# Patient Record
Sex: Female | Born: 1955 | Marital: Married | Smoking: Former smoker
Health system: Southern US, Community
[De-identification: ages and names within clinical notes are randomized; demographics above are authoritative.]

## PROBLEM LIST (undated history)

## (undated) ENCOUNTER — Emergency Department (HOSPITAL_COMMUNITY): Payer: BLUE CROSS/BLUE SHIELD | Source: Home / Self Care

## (undated) DIAGNOSIS — I10 Essential (primary) hypertension: Secondary | ICD-10-CM

## (undated) DIAGNOSIS — K219 Gastro-esophageal reflux disease without esophagitis: Secondary | ICD-10-CM

## (undated) HISTORY — DX: Essential (primary) hypertension: I10

## (undated) HISTORY — DX: Gastro-esophageal reflux disease without esophagitis: K21.9

## (undated) HISTORY — PX: TUBAL LIGATION: SHX77

## (undated) HISTORY — PX: CHOLECYSTECTOMY: SHX55

---

## 2014-01-20 ENCOUNTER — Ambulatory Visit (HOSPITAL_BASED_OUTPATIENT_CLINIC_OR_DEPARTMENT_OTHER)
Admission: RE | Admit: 2014-01-20 | Discharge: 2014-01-20 | Disposition: A | Discharge status: Home / Self Care | Payer: BC Managed Care – PPO | Source: Ambulatory Visit | Attending: Diagnostic Radiology | Admitting: Diagnostic Radiology

## 2014-01-20 ENCOUNTER — Encounter: Payer: Self-pay | Admitting: Family Medicine

## 2014-01-20 ENCOUNTER — Other Ambulatory Visit: Payer: Self-pay | Admitting: Physician Assistant

## 2014-01-20 ENCOUNTER — Ambulatory Visit (INDEPENDENT_AMBULATORY_CARE_PROVIDER_SITE_OTHER): Payer: BC Managed Care – PPO | Admitting: Family Medicine

## 2014-01-20 VITALS — BP 137/92 | HR 67 | Ht 60.0 in | Wt 160.0 lb

## 2014-01-20 DIAGNOSIS — M79662 Pain in left lower leg: Secondary | ICD-10-CM

## 2014-01-20 DIAGNOSIS — M7989 Other specified soft tissue disorders: Secondary | ICD-10-CM | POA: Diagnosis not present

## 2014-01-20 DIAGNOSIS — M25562 Pain in left knee: Secondary | ICD-10-CM

## 2014-01-20 MED ORDER — MELOXICAM 15 MG PO TABS: 15.0000 mg | ORAL_TABLET | Freq: Every day | ORAL | Status: DC

## 2014-01-20 NOTE — Patient Instructions (Signed)
Your ultrasound was negative for a blood clot. This is due to spasms/strain in your popliteus muscle. Use heat 15 minutes at a time 3-4 times a day. Meloxicam 15mg  daily with food for pain and inflammation. Consider a knee brace for support. Start physical therapy to work on regaining motion and strength in this knee. Follow up with me in 1 month for reevaluation.

## 2014-01-26 ENCOUNTER — Encounter: Payer: Self-pay | Admitting: Family Medicine

## 2014-01-26 DIAGNOSIS — M25562 Pain in left knee: Secondary | ICD-10-CM | POA: Insufficient documentation

## 2014-01-26 NOTE — Progress Notes (Signed)
PCP: Bernadette Hoitafaela Aguiar MD  Subjective:   HPI: Patient is a 58 y.o. female here for left knee pain.  Patient reports for the past 3 days she has had worsening posterior left knee pain. Did have some slight pain here a week ago but resolved. Then worsened when she walked on a treadmill. Painful to bend her knee. No prior issues. No trauma. No bruising.  Maybe some slight swelling. Has been icing, took tylenol once. No catching or locking.  Past Medical History  Diagnosis Date  . Hypertension   . GERD (gastroesophageal reflux disease)     No current outpatient prescriptions on file prior to visit.   No current facility-administered medications on file prior to visit.    Past Surgical History  Procedure Laterality Date  . Cholecystectomy    . Tubal ligation      No Known Allergies  History   Social History  . Marital Status: Married    Spouse Name: N/A    Number of Children: N/A  . Years of Education: N/A   Occupational History  . Not on file.   Social History Main Topics  . Smoking status: Never Smoker   . Smokeless tobacco: Not on file  . Alcohol Use: Not on file  . Drug Use: Not on file  . Sexual Activity: Not on file   Other Topics Concern  . Not on file   Social History Narrative    No family history on file.  BP 137/92 mmHg  Pulse 67  Ht 5' (1.524 m)  Wt 160 lb (72.576 kg)  BMI 31.25 kg/m2  Review of Systems: See HPI above.    Objective:  Physical Exam:  Gen: NAD  Left knee: No gross deformity, ecchymoses, swelling.  No palpable cords. TTP popliteal fossa.  No joint line, post patellar facet, hamstring, calf tenderness otherwise. ROM 0 - 70 degrees but very tentative, guarding with flexion. Negative ant/post drawers (cannot get fully to 90 degrees though). Negative valgus/varus testing. Negative lachmanns. Negative mcmurrays, apleys, patellar apprehension. NV intact distally.    Assessment & Plan:  1. Left knee pain - doppler u/s  done given location of pain without injury and severity - negative for DVT.  Suspect this is due to popliteus spasms/strain.  Heat, meloxicam.  Start physical therapy to work on motion, strengthening, and modalities.  Consider knee brace.  F/u in 1 month for reevaluation.

## 2014-01-26 NOTE — Assessment & Plan Note (Signed)
doppler u/s done given location of pain without injury and severity - negative for DVT.  Suspect this is due to popliteus spasms/strain.  Heat, meloxicam.  Start physical therapy to work on motion, strengthening, and modalities.  Consider knee brace.  F/u in 1 month for reevaluation.

## 2014-01-31 ENCOUNTER — Ambulatory Visit: Payer: BC Managed Care – PPO

## 2014-02-08 ENCOUNTER — Ambulatory Visit: Payer: BC Managed Care – PPO | Admitting: Rehabilitation

## 2014-02-21 ENCOUNTER — Ambulatory Visit: Payer: BC Managed Care – PPO | Attending: Family Medicine | Admitting: Rehabilitation

## 2014-02-21 DIAGNOSIS — M25562 Pain in left knee: Secondary | ICD-10-CM | POA: Insufficient documentation

## 2014-02-27 ENCOUNTER — Ambulatory Visit: Payer: BC Managed Care – PPO | Admitting: Rehabilitation

## 2014-03-02 ENCOUNTER — Ambulatory Visit: Payer: BC Managed Care – PPO | Admitting: Rehabilitation

## 2014-03-07 ENCOUNTER — Ambulatory Visit: Payer: BC Managed Care – PPO | Admitting: Rehabilitation

## 2014-03-10 ENCOUNTER — Ambulatory Visit: Payer: BC Managed Care – PPO | Admitting: Physical Therapy

## 2014-03-13 ENCOUNTER — Ambulatory Visit: Payer: BC Managed Care – PPO | Admitting: Rehabilitation

## 2014-03-15 ENCOUNTER — Encounter: Payer: BC Managed Care – PPO | Admitting: Rehabilitation

## 2014-03-20 ENCOUNTER — Encounter: Payer: BC Managed Care – PPO | Admitting: Rehabilitation

## 2014-03-23 ENCOUNTER — Encounter: Payer: BC Managed Care – PPO | Admitting: Rehabilitation

## 2014-04-22 ENCOUNTER — Emergency Department (HOSPITAL_BASED_OUTPATIENT_CLINIC_OR_DEPARTMENT_OTHER)
Admission: EM | Admit: 2014-04-22 | Discharge: 2014-04-22 | Disposition: A | Discharge status: Home / Self Care | Payer: BLUE CROSS/BLUE SHIELD | Attending: Emergency Medicine | Admitting: Emergency Medicine

## 2014-04-22 DIAGNOSIS — H578 Other specified disorders of eye and adnexa: Secondary | ICD-10-CM | POA: Insufficient documentation

## 2014-04-22 DIAGNOSIS — T466X5A Adverse effect of antihyperlipidemic and antiarteriosclerotic drugs, initial encounter: Secondary | ICD-10-CM | POA: Insufficient documentation

## 2014-04-22 DIAGNOSIS — T39395A Adverse effect of other nonsteroidal anti-inflammatory drugs [NSAID], initial encounter: Secondary | ICD-10-CM | POA: Insufficient documentation

## 2014-04-22 DIAGNOSIS — Y9389 Activity, other specified: Secondary | ICD-10-CM | POA: Diagnosis not present

## 2014-04-22 DIAGNOSIS — K1232 Oral mucositis (ulcerative) due to other drugs: Secondary | ICD-10-CM | POA: Diagnosis not present

## 2014-04-22 DIAGNOSIS — T50905A Adverse effect of unspecified drugs, medicaments and biological substances, initial encounter: Secondary | ICD-10-CM

## 2014-04-22 DIAGNOSIS — Y9289 Other specified places as the place of occurrence of the external cause: Secondary | ICD-10-CM | POA: Diagnosis not present

## 2014-04-22 DIAGNOSIS — K219 Gastro-esophageal reflux disease without esophagitis: Secondary | ICD-10-CM | POA: Insufficient documentation

## 2014-04-22 DIAGNOSIS — R1310 Dysphagia, unspecified: Secondary | ICD-10-CM

## 2014-04-22 DIAGNOSIS — I10 Essential (primary) hypertension: Secondary | ICD-10-CM | POA: Diagnosis not present

## 2014-04-22 DIAGNOSIS — X58XXXA Exposure to other specified factors, initial encounter: Secondary | ICD-10-CM | POA: Diagnosis not present

## 2014-04-22 DIAGNOSIS — K121 Other forms of stomatitis: Secondary | ICD-10-CM

## 2014-04-22 DIAGNOSIS — Z79899 Other long term (current) drug therapy: Secondary | ICD-10-CM | POA: Diagnosis not present

## 2014-04-22 DIAGNOSIS — K1230 Oral mucositis (ulcerative), unspecified: Secondary | ICD-10-CM

## 2014-04-22 DIAGNOSIS — J029 Acute pharyngitis, unspecified: Secondary | ICD-10-CM | POA: Diagnosis present

## 2014-04-22 DIAGNOSIS — Y998 Other external cause status: Secondary | ICD-10-CM | POA: Diagnosis not present

## 2014-04-22 LAB — BASIC METABOLIC PANEL
Anion gap: 7 (ref 5–15)
BUN: 18 mg/dL (ref 6–23)
CHLORIDE: 105 mmol/L (ref 96–112)
CO2: 26 mmol/L (ref 19–32)
Calcium: 9 mg/dL (ref 8.4–10.5)
Creatinine, Ser: 0.79 mg/dL (ref 0.50–1.10)
GFR calc non Af Amer: 89 mL/min — ABNORMAL LOW (ref >90)
GLUCOSE: 125 mg/dL — AB (ref 70–99)
Potassium: 3.5 mmol/L (ref 3.5–5.1)
Sodium: 138 mmol/L (ref 135–145)

## 2014-04-22 LAB — CBC WITH DIFFERENTIAL/PLATELET
BASOS PCT: 1 % (ref 0–1)
Basophils Absolute: 0.1 10*3/uL (ref 0.0–0.1)
Eosinophils Absolute: 0.3 10*3/uL (ref 0.0–0.7)
Eosinophils Relative: 3 % (ref 0–5)
HCT: 36.3 % (ref 36.0–46.0)
HEMOGLOBIN: 12 g/dL (ref 12.0–15.0)
LYMPHS PCT: 23 % (ref 12–46)
Lymphs Abs: 2.1 10*3/uL (ref 0.7–4.0)
MCH: 30.4 pg (ref 26.0–34.0)
MCHC: 33.1 g/dL (ref 30.0–36.0)
MCV: 91.9 fL (ref 78.0–100.0)
MONO ABS: 0.8 10*3/uL (ref 0.1–1.0)
Monocytes Relative: 9 % (ref 3–12)
NEUTROS ABS: 5.6 10*3/uL (ref 1.7–7.7)
Neutrophils Relative %: 64 % (ref 43–77)
PLATELETS: 306 10*3/uL (ref 150–400)
RBC: 3.95 MIL/uL (ref 3.87–5.11)
RDW: 12 % (ref 11.5–15.5)
WBC: 8.8 10*3/uL (ref 4.0–10.5)

## 2014-04-22 LAB — RAPID STREP SCREEN (MED CTR MEBANE ONLY): STREPTOCOCCUS, GROUP A SCREEN (DIRECT): NEGATIVE

## 2014-04-22 LAB — C-REACTIVE PROTEIN: CRP: 0.9 mg/dL — ABNORMAL HIGH (ref <0.60)

## 2014-04-22 LAB — SEDIMENTATION RATE: Sed Rate: 52 mm/hr — ABNORMAL HIGH (ref 0–22)

## 2014-04-22 MED ORDER — FLUCONAZOLE 200 MG PO TABS: 200.0000 mg | ORAL_TABLET | Freq: Every day | ORAL | Status: AC

## 2014-04-22 MED ORDER — TRIAMCINOLONE ACETONIDE 0.025 % EX OINT: 1.0000 "application " | TOPICAL_OINTMENT | Freq: Two times a day (BID) | CUTANEOUS | Status: AC

## 2014-04-22 NOTE — Discharge Instructions (Signed)
We suspect that you have oral stomatitis, and may be a mild infection of the throat. Mediation side effect is possible - especially something called Levonne Spiller Syndrome. Read the instruction provided on the [employer]. Come back to the ER immediately if there is skin sloughing off, or worsening of your rash. STOP TAKING THE STATIN AND THE DICLOFENAC.  Stevens-Johnson Syndrome Stevens-Johnson syndrome (SJS) is a rare, serious skin and mucous membrane disorder. SJS can involve the oral, nasal, eye, vaginal, urethral, gastrointestinal, and lower respiratory tract mucous membranes. This disorder eventually causes the top layer of skin to die and shed. This is an emergency medical condition usually needing hospitalization. The more skin that is lost, the more serious and life-threatening the disorder becomes. CAUSES  SJS is usually caused by an allergic reaction to medicine. Medicines such as antibiotics (sulfa, penicillin) and antiseizure medicines (phenytoin) have been prescribed to more than  of all patients with SJS. Other medicines known to cause SJS include medicine used for gout (allopurinol), cocaine, other antiseizure medicines (carbamazepine, barbiturates), and nonsteroidal anti-inflammatory drugs (NSAIDS, such as ibuprofen and naproxen). Other causes of SJS are viral infections (mycoplasma), bacterial infections, and rarely cancer. Sometimes the cause is unknown. SYMPTOMS SJS often begins with several days of flu-like symptoms, such as:  Fever.  Sore throat.  Cough.  Burning eyes. After the first several days, your mucous membranes become inflamed. A painful red or purplish rash develops on the face and trunk, spreads, and creates blisters over your skin. Other common signs of SJS include:  Swelling in the face.  Hives.  Skin pain.  Joint aches.  Sensitivity to light (photophobia).  Difficulty eating or swallowing due to pain in the mouth and throat.  Difficulty  urinating.  Low blood pressure and fast heart rate.  Nosebleeds and sore mucous membranes.  Blisters on your skin and mucous membranes, especially in your mouth, nose, and eyes.  Shedding of your skin.  Tongue swelling. DIAGNOSIS  Your caregiver can often recognize SJS from your medical history, a physical exam, and your symptoms.  To confirm the diagnosis, your caregiver may take a tissue sample (biopsy) from your skin to be examined under a microscope.  Blood tests and other general lab work may also be done. TREATMENT Treatment focuses on getting rid of the cause, if possible, and minimizing or preventing complications. If the cause of SJS can be removed and the skin reaction can be stopped, your skin may begin to grow again within several days. In severe cases, full recovery may take weeks to months. Prompt treatment usually shortens the length of the illness.  SJS requires hospitalization, often in an intensive care unit or burn unit.  Any medicines that could be causing SJS are stopped.  Pain medicines will be given as needed for discomfort.  Antibiotics are given for infection, if needed.  Immunoglobulin may be given by vein to try to stop the disease process.  Skin loss can result in large losses of fluid from your body. These fluids must be replaced. You may receive fluids and nutrients through a tube placed through your nose and into your stomach (nasogastric tube).  Blisters that have not broken may be left to heal on their own. Your caregiver may gently remove dead skin and put a bandage (dressing) on those areas.  Skin grafting may be used on large areas, but this is rarely needed. PREVENTION Preventing a recurrence of SJS is critical. A recurrence is usually more severe than the first  episode and can be life-threatening.  If your SJS was caused by a medicine, be sure to avoid that medicine and others in the same drug class. This information will be listed in your  medical chart.  If herpes virus caused your reaction, you may need to take daily antiviral medicines to prevent a recurrence. HOME CARE INSTRUCTIONS  Know what caused your reaction. If a medicine caused your reaction, learn the name of that medicine and any other related medicines that could cause a recurrence.  Let all your caregivers know about your history of SJS. If the reaction was caused by a medicine, provide your caregivers with the name of that medicine.  Wear a medical bracelet or necklace that says you had SJS and what caused it. SEEK IMMEDIATE MEDICAL CARE IF:  You cough up thick saliva mixed with mucus (sputum).  You have a headache.  You feel very tired.  You have joint aches and pain. Document Released: 11/21/2010 Document Revised: 06/02/2011 Document Reviewed: 11/21/2010 [facility] Patient Information 2015 [place], [place]. This information is not intended to replace advice given to you by your health care provider. Make sure you discuss any questions you have with your health care provider.    Oral Ulcers Oral ulcers are painful, shallow sores around the lining of the mouth. They can affect the gums, the inside of the lips, and the cheeks. (Sores on the outside of the lips and on the face are different.) They typically first occur in school-aged children and teenagers. Oral ulcers may also be called canker sores or cold sores. CAUSES  Canker sores and cold sores can be caused by many factors including:  Infection.  Injury.  Sun exposure.  Medications.  Emotional stress.  Food allergies.  Vitamin deficiencies.  Toothpastes containing sodium lauryl sulfate. The herpes virus can be the cause of mouth ulcers. The first infection can be severe and cause 10 or more ulcers on the gums, tongue, and lips with fever and difficulty in swallowing. This infection usually occurs between the ages of 1 and 3 years.  SYMPTOMS  The typical sore is about  inch (6 mm) in size  and is an oval or round ulcer with red borders. DIAGNOSIS  Your caregiver can diagnose simple oral ulcers by examination. Additional testing is usually not required.  TREATMENT  Treatment is aimed at pain relief. Generally, oral ulcers resolve by themselves within 1 to 2 weeks without medication and are not contagious unless caused by herpes (and other viruses). Antibiotics are not effective with mouth sores. Avoid direct contact with others until the ulcer is completely healed. See your caregiver for follow-up care as recommended. Also:  Offer a soft diet.  Encourage plenty of fluids to prevent dehydration. Popsicles and milk shakes can be helpful.  Avoid acidic and salty foods and drinks such as orange juice.  Infants and young children will often refuse to drink because of pain. Using a teaspoon, cup, or syringe to give small amounts of fluids frequently can help prevent dehydration.  Cold compresses on the face may help reduce pain.  Pain medication can help control soreness.  A solution of diphenhydramine mixed with a liquid antacid can be useful to decrease the soreness of ulcers. Consult a caregiver for the dosing.  Liquids or ointments with a numbing ingredient may be helpful when used as recommended.  Older children and teenagers can rinse their mouth with a salt-water mixture (1/2 teaspoon of salt in 8 ounces of water) four times a  day. This treatment is uncomfortable but may reduce the time the ulcers are present.  There are many over-the-counter throat lozenges and medications available for oral ulcers. Their effectiveness has not been studied.  Consult your medical caregiver prior to using homeopathic treatments for oral ulcers. SEEK MEDICAL CARE IF:   You think your child needs to be seen.  The pain worsens and you cannot control it.  There are 4 or more ulcers.  The lips and gums begin to bleed and crust.  A single mouth ulcer is near a tooth that is causing a  toothache or pain.  Your child has a fever, swollen face, or swollen glands.  The ulcers began after starting a medication.  Mouth ulcers keep reoccurring or last more than 2 weeks.  You think your child is not taking adequate fluids. SEEK IMMEDIATE MEDICAL CARE IF:   Your child has a high fever.  Your child is unable to swallow or becomes dehydrated.  Your child looks or acts very ill.  An ulcer caused by a chemical your child accidentally put in their mouth. Document Released: 04/17/2004 Document Revised: 07/25/2013 Document Reviewed: 11/30/2008 [facility] Patient Information 2015 [place], [place]. This information is not intended to replace advice given to you by your health care provider. Make sure you discuss any questions you have with your health care provider.  Stomatitis Stomatitis is an inflammation of the mucous lining of the mouth. It can affect part of the mouth or the whole mouth. The intensity of symptoms can range from mild to severe. It can affect your cheek, teeth, gums, lips, or tongue. In almost all cases, the lining of the mouth becomes swollen, red, and painful. Painful ulcers can develop in your mouth. Stomatitis recurs in some people. CAUSES  There are many common causes of stomatitis. They include:  Viruses (such as cold sores or shingles).  Canker sores.  Bacteria (such as ulcerative gingivitis or sexually transmitted diseases).  Fungus or yeast (such as candidiasis or oral thrush).  Poor oral hygiene and poor nutrition (Vincent's stomatitis or trench mouth).  Lack of vitamin B, vitamin C, or niacin.  Dentures or braces that do not fit properly.  High acid foods (uncommon).  Sharp or broken teeth.  Cheek biting.  Breathing through the mouth.  Chewing tobacco.  Allergy to toothpaste, mouthwash, candy, gum, lipstick, or some medicines.  Burning your mouth with hot drinks or food.  Exposure to dyes, heavy metals, acid fumes, or mineral  dust. SYMPTOMS   Painful ulcers in the mouth.  Blisters in the mouth.  Bleeding gums.  Swollen gums.  Irritability.  Bad breath.  Bad taste in the mouth.  Fever.  Trouble eating because of burning and pain in the mouth. DIAGNOSIS  Your caregiver will examine your mouth and look for bleeding gums and mouth ulcers. Your caregiver may ask you about the medicines you are taking. Your caregiver may suggest a blood test and tissue sample (biopsy) of the mouth ulcer or mass if either is present. This will help find the cause of your condition. TREATMENT  Your treatment will depend on the cause of your condition. Your caregiver will first try to treat your symptoms.   You may be given pain medicine. Topical anesthetic may be used to numb the area if you have severe pain.  Your caregiver may prescribe antibiotic medicine if you have a bacterial infection.  Your caregiver may prescribe antifungal medicine if you have a fungal infection.  You may need  to take antiviral medicine if you have a viral infection like herpes.  You may be asked to use medicated mouth rinses.  Your caregiver will advise you about proper brushing and using a soft toothbrush. You also need to get your teeth cleaned regularly. HOME CARE INSTRUCTIONS   Maintain good oral hygiene. This is especially important for transplant patients.  Brush your teeth carefully with a soft, nylon-bristled toothbrush.  Floss at least 2 times a day.  Clean your mouth after eating.  Rinse your mouth with salt water 3 to 4 times a day.  Gargle with cold water.  Use topical numbing medicines to decrease pain if recommended by your caregiver.  Stop smoking, and stop using chewing or smokeless tobacco.  Avoid eating hot and spicy foods.  Eat soft and bland food.  Reduce your stress wherever possible.  Eat healthy and nutritious foods. SEEK MEDICAL CARE IF:   Your symptoms persist or get worse.  You develop new  symptoms.  Your mouth ulcers are present for more than 3 weeks.  Your mouth ulcers come back frequently.  You have increasing difficulty with normal eating and drinking.  You have increasing fatigue or weakness.  You develop loss of appetite or nausea. SEEK IMMEDIATE MEDICAL CARE IF:   You have a fever.  You develop pain, redness, or sores around one or both eyes.  You cannot eat or drink because of pain or other symptoms.  You develop worsening weakness, or you faint.  You develop vomiting or diarrhea.  You develop chest pain, shortness of breath, or rapid and irregular heartbeats. MAKE SURE YOU:  Understand these instructions.  Will watch your condition.  Will get help right away if you are not doing well or get worse. Document Released: 01/05/2007 Document Revised: 06/02/2011 Document Reviewed: 10/17/2010 [facility] Patient Information 2015 [place], [place]. This information is not intended to replace advice given to you by your health care provider. Make sure you discuss any questions you have with your health care provider.

## 2014-04-22 NOTE — ED Provider Notes (Signed)
CSN: [id number]     Arrival date & time 04/22/14  0050 History   First MD Initiated Contact with Patient 04/22/14 0130     Chief Complaint  Patient presents with  . Allergic Reaction     (Consider location/radiation/quality/duration/timing/severity/associated sxs/prior Treatment) HPI Comments: Pt comes in with cc of allergic type reaction. She reports that 3 weeks ago, she was started on simvastatin and 2 weeks ago she was started on diclofenac 2 weeks ago. She started developing a sore throat a week ago, and it has gotten worse. She has stopped taking both of those medicine on Wednesday after reading the side effect profile. She has no cough. Pt reports very "sensitive" to meds, but she has no known side effects. She has no dib, or oral swelling, but reports ulcers to her mouth. No fevers, chills. No skin rash. Pt thinks her eyes are slightly dry, and that she has rash in her groin x 1 week as well.   ROS 10 Systems reviewed and are negative for acute change except as noted in the HPI.     Patient is a 59 y.o. female presenting with allergic reaction. The history is provided by the patient.  Allergic Reaction Presenting symptoms: rash     Past Medical History  Diagnosis Date  . Hypertension   . GERD (gastroesophageal reflux disease)    Past Surgical History  Procedure Laterality Date  . Cholecystectomy    . Tubal ligation     No family history on file. History  Substance Use Topics  . Smoking status: Never Smoker   . Smokeless tobacco: Not on file  . Alcohol Use: Not on file   OB History    No data available     Review of Systems  Skin: Positive for rash.  All other systems reviewed and are negative.     Allergies  Review of patient's allergies indicates no known allergies.  Home Medications   Prior to Admission medications   Medication Sig Start Date End Date Taking? Authorizing Provider  ACIPHEX 20 MG tablet  01/05/14   Historical Provider, MD   fluconazole (DIFLUCAN) 200 MG tablet Take 1 tablet (200 mg total) by mouth daily. 04/22/14   Derwood KaplanAnkit Amelia Burgard, MD  TOPROL XL 50 MG 24 hr tablet  01/05/14   Historical Provider, MD  triamcinolone (KENALOG) 0.025 % ointment Apply 1 application topically 2 (two) times daily. 04/22/14   Roopa Graver Rhunette CroftNanavati, MD   BP 121/76 mmHg  Pulse 72  Temp(Src) 98.5 F (36.9 C) (Oral)  Resp 16  Ht 5' (1.524 m)  Wt 170 lb (77.111 kg)  BMI 33.20 kg/m2  SpO2 95% Physical Exam  Constitutional: She is oriented to person, place, and time. She appears well-developed and well-nourished.  HENT:  Head: Normocephalic and atraumatic.  Pt has 3 ulcers in her mouth - Rt lateral tongue, L lower lip and L buccal mucosa.  Eyes: Conjunctivae and EOM are normal. Pupils are equal, round, and reactive to light.  Mild redness to the right eye  Neck: Neck supple.  Cardiovascular: Normal rate, regular rhythm and normal heart sounds.   No murmur heard. Pulmonary/Chest: Effort normal. No respiratory distress.  Abdominal: Soft. She exhibits no distension. There is no tenderness. There is no rebound and no guarding.  Genitourinary: Vagina normal.  Speculum exam reveals no mucosal rash.  Neurological: She is alert and oriented to person, place, and time.  Skin: Skin is warm and dry.  No large patch/plaque/bulla seen grossly  Nursing  note and vitals reviewed.   ED Course  Procedures (including critical care time) Labs Review Labs Reviewed  SEDIMENTATION RATE - Abnormal; Notable for the following:    Sed Rate 52 (*)    All other components within normal limits  BASIC METABOLIC PANEL - Abnormal; Notable for the following:    Glucose, Bld 125 (*)    GFR calc non Af Amer 89 (*)    All other components within normal limits  RAPID STREP SCREEN  CULTURE, GROUP A STREP  CBC WITH DIFFERENTIAL/PLATELET  C-REACTIVE PROTEIN    Imaging Review No results found.   EKG Interpretation None      MDM   Final diagnoses:   Stomatitis and mucositis  Dysphagia  Medication side effect, initial encounter    Pt comes in with cc of side effect. She is concerned that since her sorethroat is not better, she is having throat swelling - which she noted was severe allergic rxn to the statin and diclofenac she was started on. 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  She has no uveitis. She has no genital lesions. There are oral ulcers however, and nsaids are high cause for SJS. We discussed the concerns and findings with the patient, and have decided to d/c her for now. Pt is to return id she has any worsening of her sx and she has been provided with SJS information. No steroids for now. She sees her PCP on Monday, which is reassuring.  Derwood Kaplan, MD 04/22/14 [phone]

## 2014-04-22 NOTE — ED Notes (Signed)
C/o sorethroat   Throat is red   Pt states thinks reaction from meds she started 3 weeks ago

## 2014-04-22 NOTE — ED Notes (Signed)
Pt sts reaction to Simvastatin and/or diflucenac- started simvastatin 3 weeks ago, started having sores in mouth, sore throat 1 week later which continues to get worse; started diflucenac 2 weeks ago. Pt also has erythema to R eye.

## 2014-04-24 LAB — CULTURE, GROUP A STREP

## 2014-08-02 ENCOUNTER — Ambulatory Visit: Payer: Self-pay | Admitting: Podiatry

## 2014-08-04 ENCOUNTER — Ambulatory Visit (INDEPENDENT_AMBULATORY_CARE_PROVIDER_SITE_OTHER): Payer: BLUE CROSS/BLUE SHIELD | Admitting: Podiatry

## 2014-08-04 ENCOUNTER — Encounter: Payer: Self-pay | Admitting: Podiatry

## 2014-08-04 VITALS — BP 123/82 | HR 63 | Ht 60.0 in | Wt 160.0 lb

## 2014-08-04 DIAGNOSIS — M722 Plantar fascial fibromatosis: Secondary | ICD-10-CM

## 2014-08-04 DIAGNOSIS — M7730 Calcaneal spur, unspecified foot: Secondary | ICD-10-CM | POA: Insufficient documentation

## 2014-08-04 NOTE — Patient Instructions (Addendum)
Seen for heel pain on both side. Reviewed available treatment options. Both feet casted for Orthotics. We will contact for coverage info before send them out to lab.

## 2014-08-04 NOTE — Progress Notes (Signed)
Subjective: 59 year old female presents complaining of heel pain on both feet x 1 month R>L. Pain is through out the day especially after a prolonged standing or walking. Wears soft crocs or sandals at home. She is not on feet much.  Stated that she is allergic to too many medication and wants to avoid any injection or medications.   Review of Systems - General ROS: negative Ophthalmic ROS: negative ENT ROS: negative Allergy and Immunology ROS: negative Hematological and Lymphatic ROS: negative Breast ROS: negative for breast lumps Respiratory ROS: no cough, shortness of breath, or wheezing Cardiovascular ROS: Treated for Hypertension. Gastrointestinal ROS: Acid reflux. Genito-Urinary ROS: no dysuria, trouble voiding, or hematuria Musculoskeletal ROS: Left knee pain x 9 month and heel pain x 1 month.  Neurological ROS: no TIA or stroke symptoms Dermatological ROS: negative.  Objective:  Dermatologic: Normal findings. No abnormal skin lesions.  Vascular: All pedal pulses are palpable bilateral.  Neurologic:All epicritic and tactile sensations are grossly intact. Orthopedic: High arch cavus type foot with no gross deformities. Good ankle joint and mid foot range of motion.  Radiographic examination reveal cavus type foot with high Calcaneal inclination angle, plantar and posterior heel spur bilateral in lateral view.  AP view show short first ray bilateral. All joint are congruous without narrowing of space or sclerotic changes. No acute changes noted in osseous or articular surfaces bilateral.   Assessment: Plantar fasciitis bilateral. Calcaneal spur bilateral. Pes Cavus bilateral  Plan:  Reviewed clinical findings and available treatment options. Patient refused to receive cortisone injection or NSAIA.  Both feet casted for custom orthotics. Advised to use the orthotics in lace up shoes. Patient requested the orthotics be fitted for dress shoes since she will not be wearing lace up  tennis shoes.

## 2014-09-29 ENCOUNTER — Ambulatory Visit: Payer: BLUE CROSS/BLUE SHIELD | Admitting: Podiatry

## 2015-03-28 IMAGING — US US EXTREM LOW VENOUS*L*
1 series · 13 of 24 positions shown · non-contrast
Comparison: None.

CLINICAL DATA: 57-year-old female with 2 weeks of left lower
extremity pain and swelling. No known injury. Initial encounter.



[Series 1: us extrem low venous*left* · 0.08mm/px · 13 of 25 slices shown]
[im 1/25]
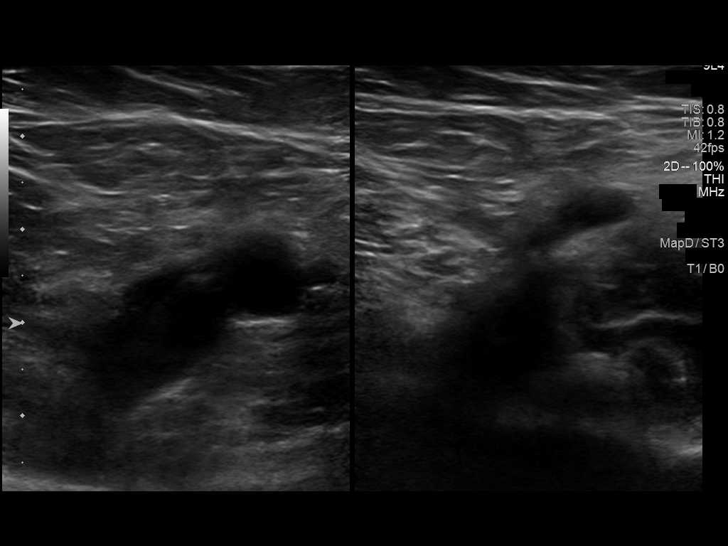
[im 3/25]
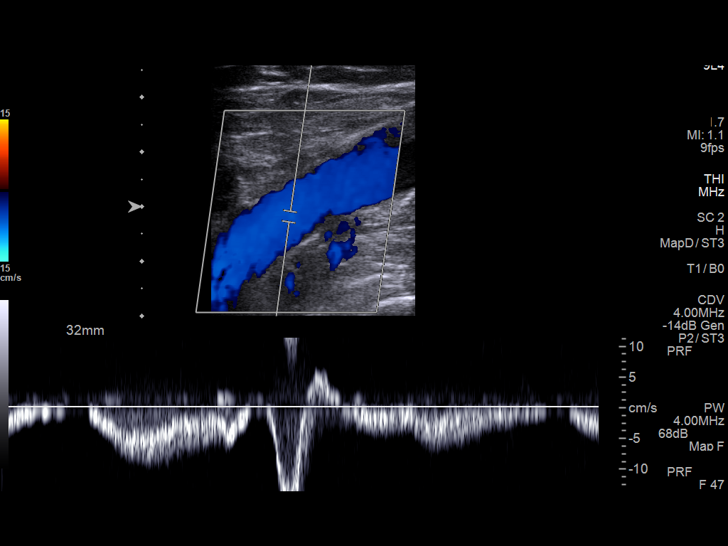
[im 5/25]
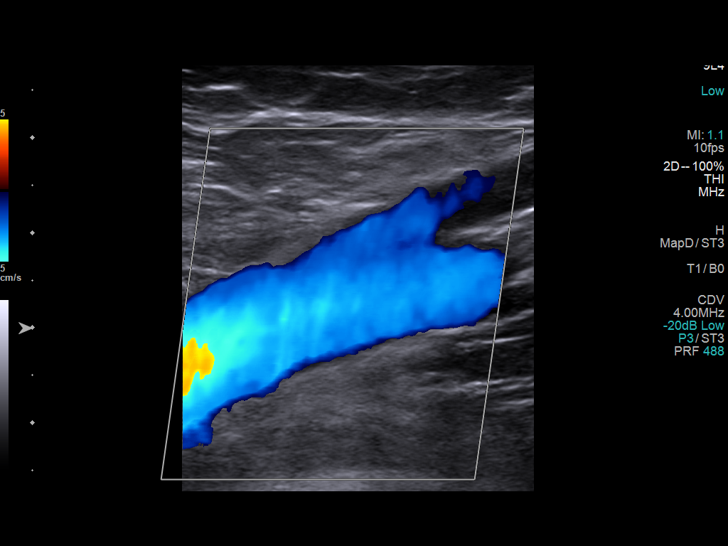
[im 7/25]
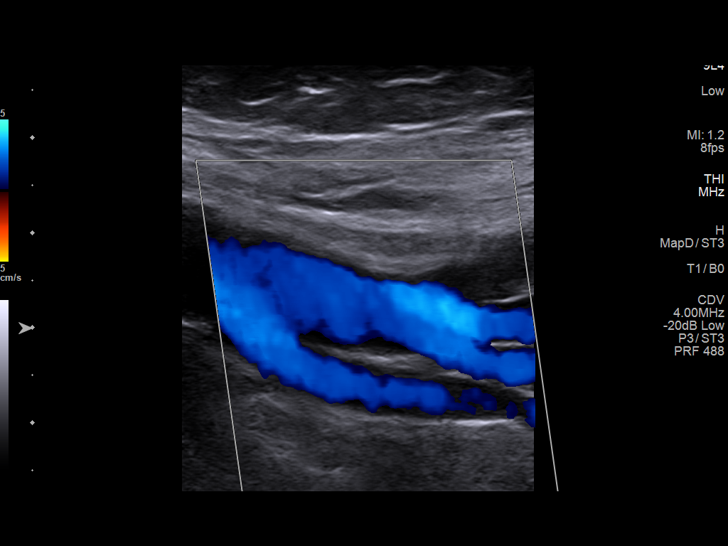
[im 9/25]
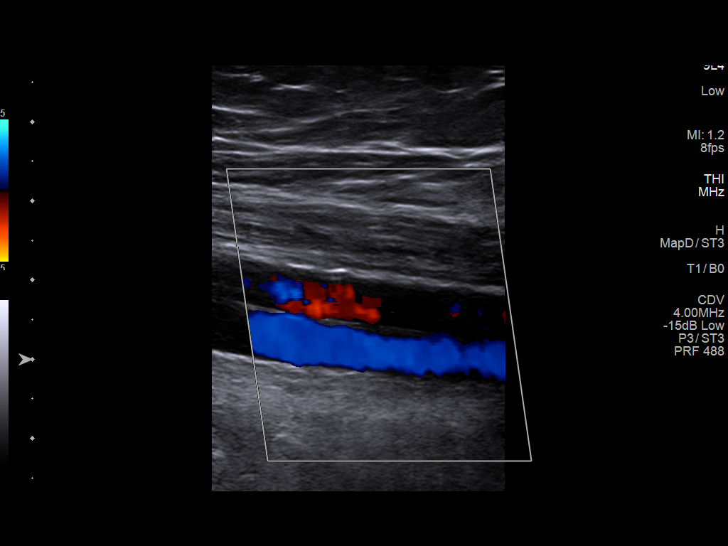
[im 11/25]
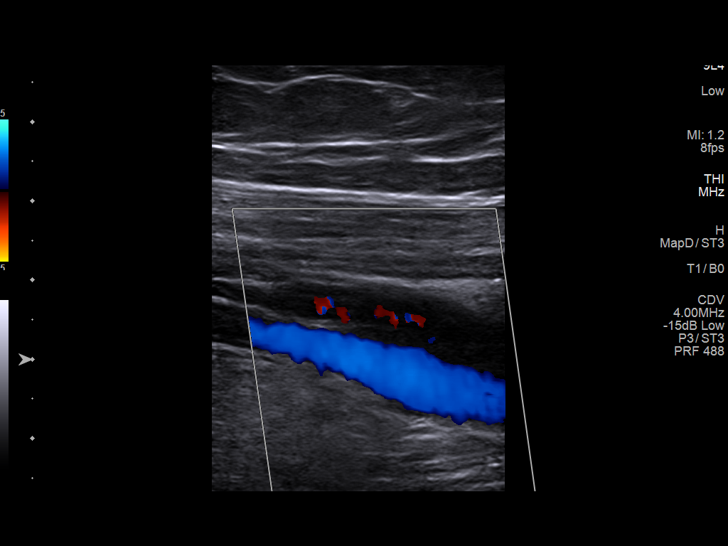
[im 13/25]
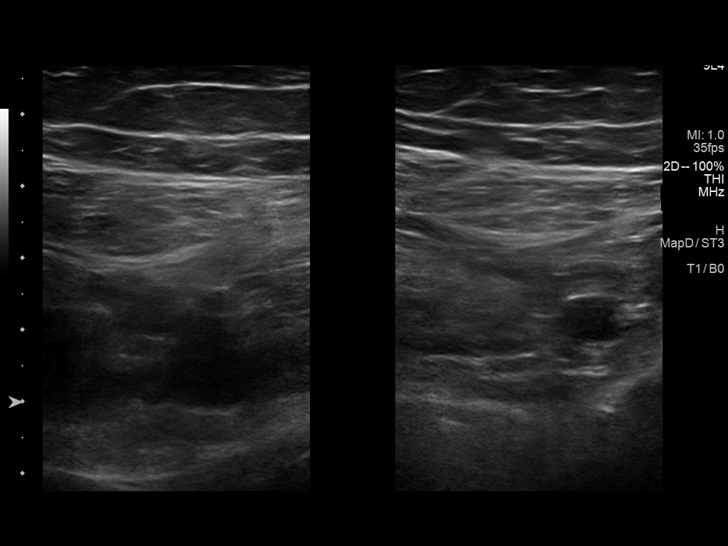
[im 14/25]
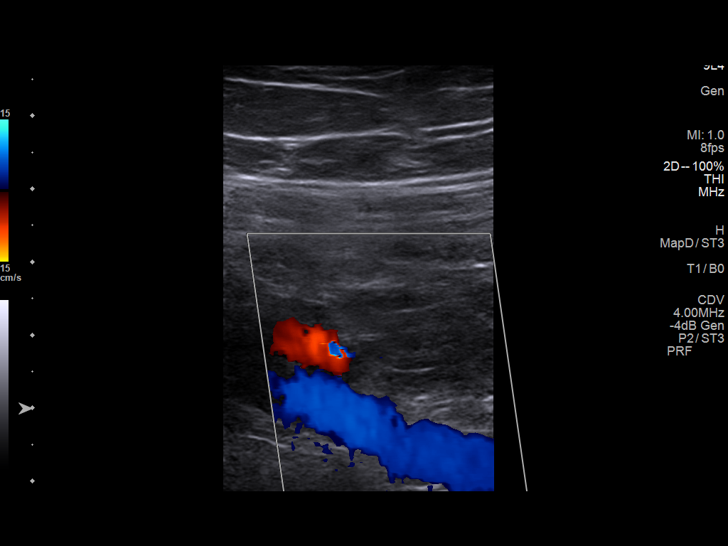
[im 16/25]
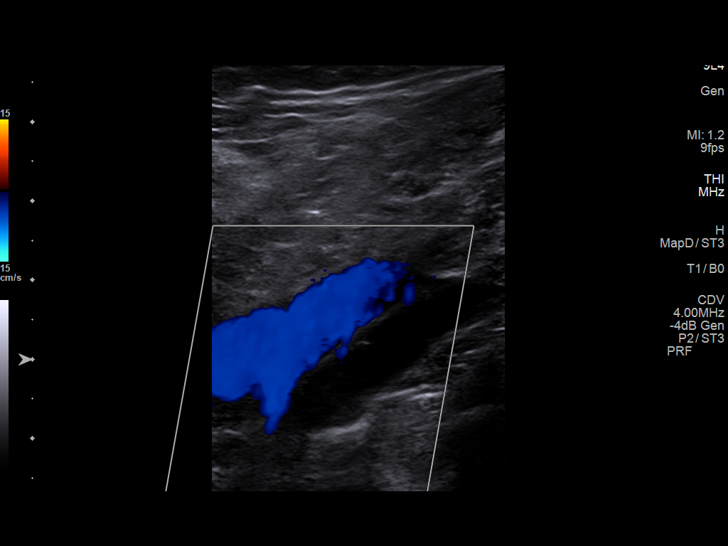
[im 18/25]
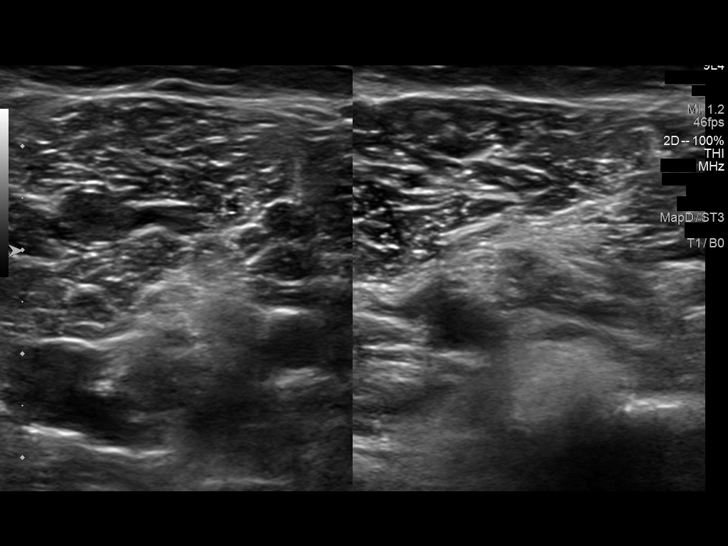
[im 20/25]
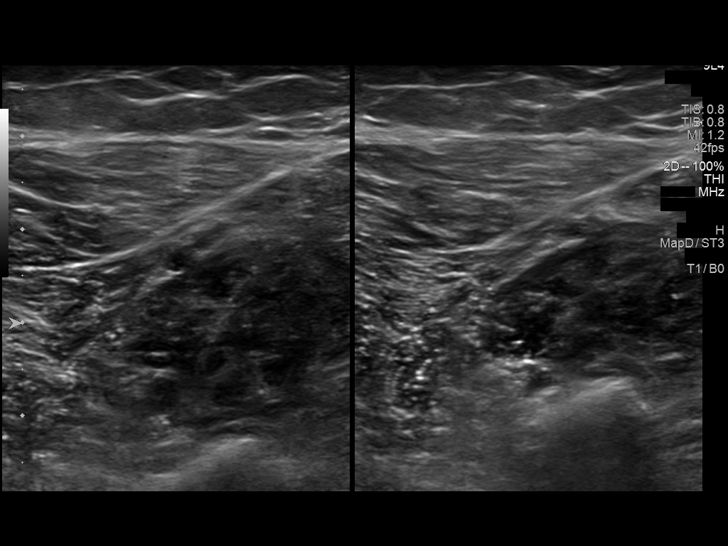
[im 22/25]
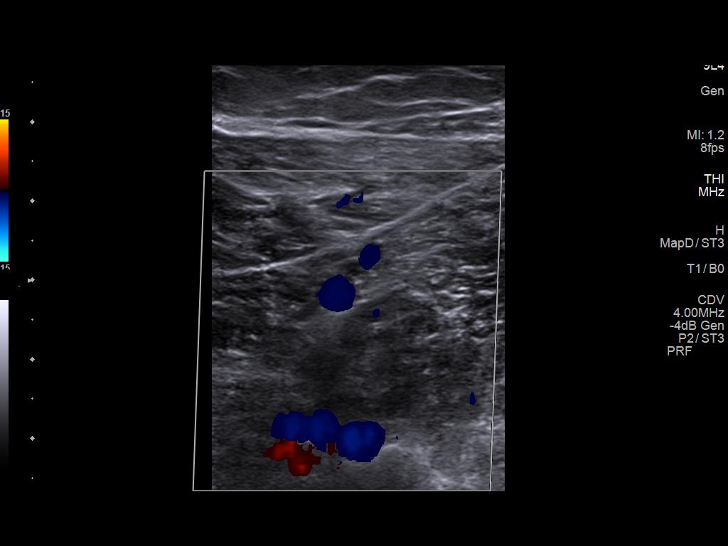
[im 25/25]
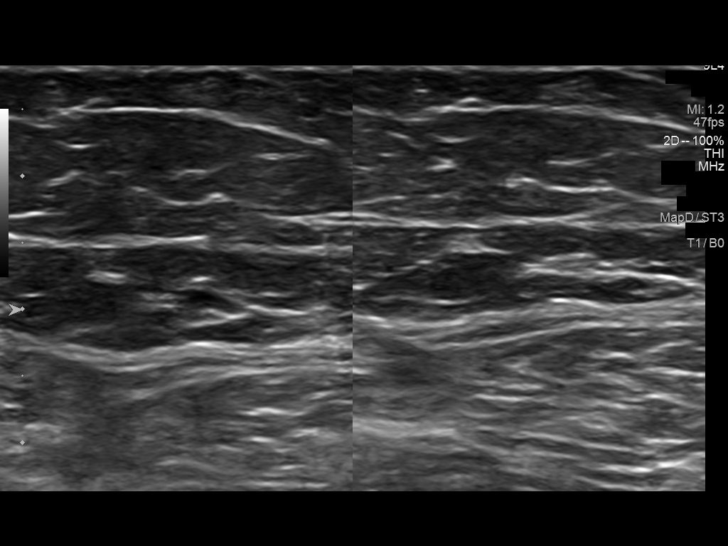

[13 of 24 positions shown; findings below may reference images not displayed]

FINDINGS: Common Femoral Vein: No evidence of thrombus. Normal
compressibility, respiratory phasicity and response to augmentation.

Saphenofemoral Junction: No evidence of thrombus. Normal
compressibility and flow on color Doppler imaging.

Profunda Femoral Vein: No evidence of thrombus. Normal
compressibility and flow on color Doppler imaging.

Femoral Vein: No evidence of thrombus. Normal compressibility,
respiratory phasicity and response to augmentation.

Popliteal Vein: No evidence of thrombus. Normal compressibility,
respiratory phasicity and response to augmentation.

Calf Veins: No evidence of thrombus. Normal compressibility and flow
on color Doppler imaging.

Superficial Great Saphenous Vein: No evidence of thrombus. Normal
compressibility and flow on color Doppler imaging.

Venous Reflux:  None.

Other Findings:  None.
IMPRESSION: No evidence of left lower extremity deep venous thrombosis.
